# Patient Record
Sex: Male | Born: 1965 | Race: Black or African American | Hispanic: No | Marital: Married | Smoking: Never smoker
Health system: Southern US, Community
[De-identification: ages and names within clinical notes are randomized; demographics above are authoritative.]

## PROBLEM LIST (undated history)

## (undated) DIAGNOSIS — E785 Hyperlipidemia, unspecified: Secondary | ICD-10-CM

## (undated) DIAGNOSIS — Z860100 Personal history of colon polyps, unspecified: Secondary | ICD-10-CM

## (undated) DIAGNOSIS — K219 Gastro-esophageal reflux disease without esophagitis: Secondary | ICD-10-CM

## (undated) HISTORY — PX: OTHER SURGICAL HISTORY: SHX169

## (undated) HISTORY — DX: Personal history of colon polyps, unspecified: Z86.0100

## (undated) HISTORY — DX: Personal history of colonic polyps: Z86.010

## (undated) HISTORY — DX: Gastro-esophageal reflux disease without esophagitis: K21.9

## (undated) HISTORY — DX: Hyperlipidemia, unspecified: E78.5

---

## 2000-04-27 ENCOUNTER — Encounter: Admission: RE | Admit: 2000-04-27 | Discharge: 2000-04-27 | Payer: Self-pay | Admitting: Family Medicine

## 2000-04-27 ENCOUNTER — Encounter: Payer: Self-pay | Admitting: Family Medicine

## 2008-04-23 ENCOUNTER — Emergency Department (HOSPITAL_COMMUNITY): Admission: EM | Admit: 2008-04-23 | Discharge: 2008-04-23 | Payer: Self-pay | Admitting: Emergency Medicine

## 2008-05-05 ENCOUNTER — Ambulatory Visit (HOSPITAL_COMMUNITY): Admission: RE | Admit: 2008-05-05 | Discharge: 2008-05-05 | Payer: Self-pay | Admitting: Urology

## 2008-11-19 ENCOUNTER — Ambulatory Visit: Payer: Self-pay | Admitting: Gastroenterology

## 2008-12-03 ENCOUNTER — Ambulatory Visit: Payer: Self-pay | Admitting: Gastroenterology

## 2008-12-03 ENCOUNTER — Encounter: Payer: Self-pay | Admitting: Gastroenterology

## 2008-12-05 ENCOUNTER — Encounter: Payer: Self-pay | Admitting: Gastroenterology

## 2009-05-31 IMAGING — CR DG ABDOMEN 1V
1 series · 1 of 1 positions shown · non-contrast
Comparison: CT scan [DATE]

CLINICAL DATA: Right UPJ stone.  For lithotripsy.

ABDOMEN - 1 VIEW

[t abdomen supine]
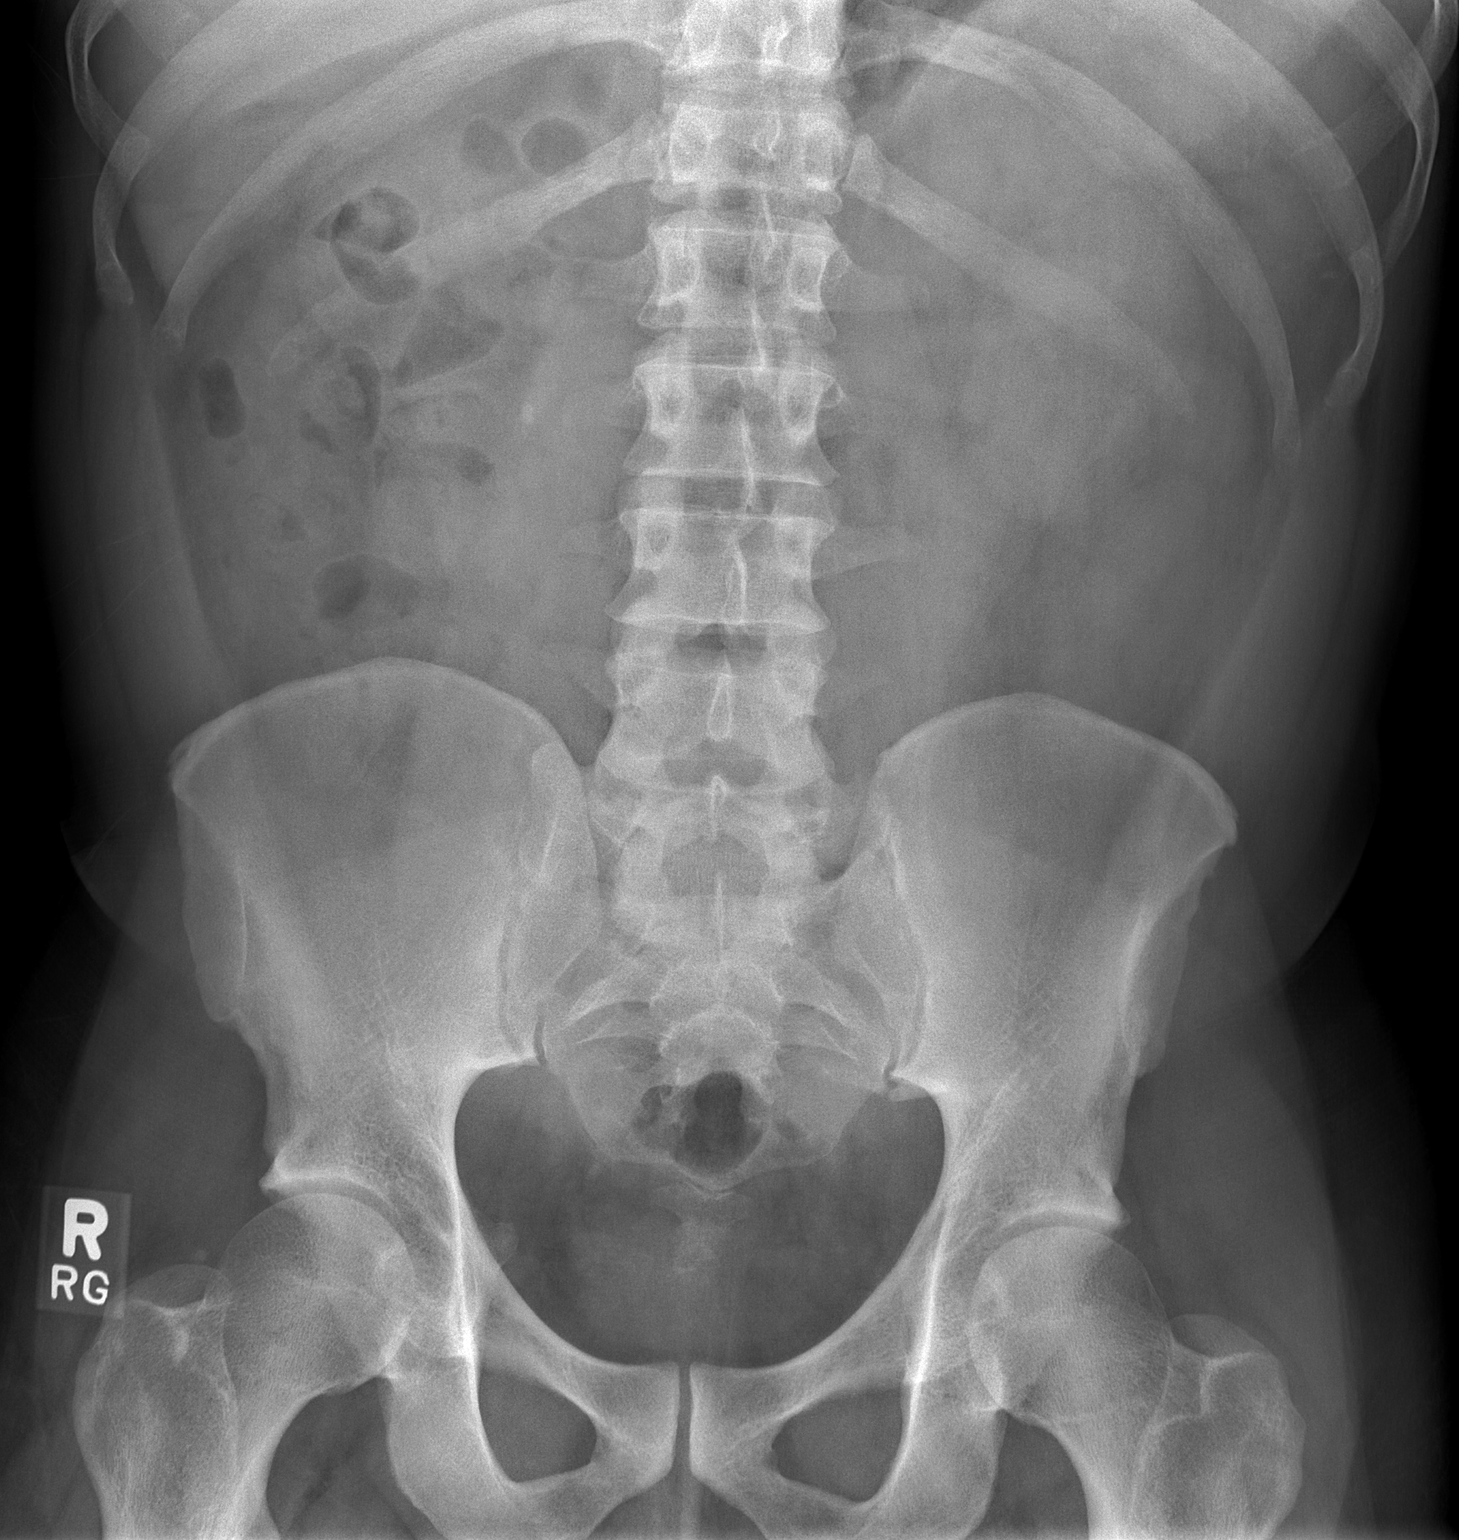

[1 of 1 positions shown; findings below may reference images not displayed]

FINDINGS: There is a 4 mm stone on the right consistent with a
location at the UPJ or proximal ureter.  No other urinary tract
stones are visible.  Bowel gas pattern normal.  No bony finding of
note.
IMPRESSION: 4 mm stone at the right UPJ or proximal ureter.

## 2009-09-24 ENCOUNTER — Emergency Department (HOSPITAL_COMMUNITY): Admission: EM | Admit: 2009-09-24 | Discharge: 2009-09-24 | Payer: Self-pay | Admitting: Emergency Medicine

## 2011-01-25 LAB — DIFFERENTIAL
Basophils Relative: 1 % (ref 0–1)
Eosinophils Absolute: 0 10*3/uL (ref 0.0–0.7)
Lymphocytes Relative: 59 % — ABNORMAL HIGH (ref 12–46)
Monocytes Absolute: 0.3 10*3/uL (ref 0.1–1.0)

## 2011-01-25 LAB — BASIC METABOLIC PANEL
BUN: 8 mg/dL (ref 6–23)
CO2: 30 mEq/L (ref 19–32)
Calcium: 9.1 mg/dL (ref 8.4–10.5)
Chloride: 101 mEq/L (ref 96–112)
Creatinine, Ser: 1.17 mg/dL (ref 0.4–1.5)
GFR calc Af Amer: >60 mL/min (ref >60)
GFR calc non Af Amer: >60 mL/min (ref >60)
Glucose, Bld: 118 mg/dL — ABNORMAL HIGH (ref 70–99)
Potassium: 3.5 mEq/L (ref 3.5–5.1)
Sodium: 137 mEq/L (ref 135–145)

## 2011-01-25 LAB — URINALYSIS, ROUTINE W REFLEX MICROSCOPIC
Leukocytes, UA: NEGATIVE
Protein, ur: 30 mg/dL — AB
Specific Gravity, Urine: 1.029 (ref 1.005–1.030)
Urobilinogen, UA: 0.2 mg/dL (ref 0.0–1.0)

## 2011-01-25 LAB — URINE MICROSCOPIC-ADD ON

## 2011-01-25 LAB — CBC
HCT: 40.1 % (ref 39.0–52.0)
Hemoglobin: 13.9 g/dL (ref 13.0–17.0)
MCV: 91.4 fL (ref 78.0–100.0)
RDW: 13.7 % (ref 11.5–15.5)

## 2011-07-21 LAB — BASIC METABOLIC PANEL
BUN: 9
Calcium: 9.4
Creatinine, Ser: 1.17
GFR calc non Af Amer: >60
Glucose, Bld: 143 — ABNORMAL HIGH
Potassium: 3.4 — ABNORMAL LOW

## 2011-07-21 LAB — DIFFERENTIAL
Basophils Absolute: 0
Eosinophils Relative: 1
Lymphocytes Relative: 60 — ABNORMAL HIGH
Lymphs Abs: 3.6
Neutro Abs: 1.9
Neutrophils Relative %: 32 — ABNORMAL LOW

## 2011-07-21 LAB — CBC
HCT: 41.1
Platelets: 233
RDW: 13.6
WBC: 6

## 2011-07-21 LAB — LIPASE, BLOOD: Lipase: 29

## 2011-12-28 ENCOUNTER — Encounter: Payer: Self-pay | Admitting: Gastroenterology

## 2012-08-07 ENCOUNTER — Encounter: Payer: Self-pay | Admitting: Gastroenterology

## 2014-02-03 ENCOUNTER — Encounter: Payer: Self-pay | Admitting: Internal Medicine

## 2014-03-27 ENCOUNTER — Encounter: Payer: Self-pay | Admitting: Internal Medicine

## 2014-04-29 ENCOUNTER — Ambulatory Visit (AMBULATORY_SURGERY_CENTER): Payer: Self-pay

## 2014-04-29 VITALS — Ht 69.0 in | Wt 172.6 lb

## 2014-04-29 DIAGNOSIS — Z800 Family history of malignant neoplasm of digestive organs: Secondary | ICD-10-CM

## 2014-04-29 DIAGNOSIS — Z86010 Personal history of colonic polyps: Secondary | ICD-10-CM

## 2014-04-29 MED ORDER — MOVIPREP 100 G PO SOLR: ORAL | Status: DC

## 2014-04-29 NOTE — Progress Notes (Signed)
Per pt, no allergies to soy or egg products.Pt not taking any weight loss meds or using  O2 at home. 

## 2014-05-13 ENCOUNTER — Encounter: Payer: Self-pay | Admitting: Internal Medicine

## 2014-05-13 ENCOUNTER — Ambulatory Visit (AMBULATORY_SURGERY_CENTER): Payer: BC Managed Care – PPO | Admitting: Internal Medicine

## 2014-05-13 VITALS — BP 115/62 | HR 58 | Temp 97.3°F | Resp 22 | Ht 69.0 in | Wt 172.0 lb

## 2014-05-13 DIAGNOSIS — Z1211 Encounter for screening for malignant neoplasm of colon: Secondary | ICD-10-CM

## 2014-05-13 DIAGNOSIS — Z86010 Personal history of colonic polyps: Secondary | ICD-10-CM

## 2014-05-13 DIAGNOSIS — Z800 Family history of malignant neoplasm of digestive organs: Secondary | ICD-10-CM

## 2014-05-13 MED ORDER — SODIUM CHLORIDE 0.9 % IV SOLN: 500.0000 mL | INTRAVENOUS | Status: DC

## 2014-05-13 NOTE — Patient Instructions (Signed)

## 2014-05-13 NOTE — Op Note (Signed)
[facility] [address] [place], [id number]   COLONOSCOPY PROCEDURE REPORT  PATIENT: Babs SciaraCotton, Zakiah  MR#: [id number] BIRTHDATE: October 05, 1966 , 47  yrs. old GENDER: Male ENDOSCOPIST: Roxy CedarJohn N Perry Jr, MD REFERRED IO:[id number]:Surveillance Program Recall PROCEDURE DATE:  05/13/2014 PROCEDURE:   Colonoscopy, surveillance First Screening Colonoscopy - Avg.  risk and is 50 yrs.  old or older - No.  Prior Negative Screening - Now for repeat screening. N/A  History of Adenoma - Now for follow-up colonoscopy & has been > or = to 3 yrs.  Yes hx of adenoma.  Has been 3 or more years since last colonoscopy.  Polyps Removed Today? No.  Recommend repeat exam, <10 yrs? Yes.  High risk (family or personal hx). ASA CLASS:   Class I INDICATIONS:Patient's immediate family history of colon cancer (sister 4340's) and Patient's personal history of adenomatous colon polyps.   Index exam 2004 (-) ; 2010 (SA) MEDICATIONS: MAC sedation, administered by CRNA and propofol (Diprivan) 200mg  IV  DESCRIPTION OF PROCEDURE:   After the risks benefits and alternatives of the procedure were thoroughly explained, informed consent was obtained.  A digital rectal exam revealed no abnormalities of the rectum.   The LB [id number]  endoscope was introduced through the anus and advanced to the cecum, which was identified by both the appendix and ileocecal valve. No adverse events experienced.   The quality of the prep was excellent, using MoviPrep  The instrument was then slowly withdrawn as the colon was fully examined.      COLON FINDINGS: A normal appearing cecum, ileocecal valve, and appendiceal orifice were identified.  The ascending, hepatic flexure, transverse, splenic flexure, descending, sigmoid colon and rectum appeared unremarkable.  No polyps or cancers were seen. Retroflexed views revealed no abnormalities. The time to cecum=1 minutes 29 seconds.  Withdrawal time=9 minutes 39 seconds.   The scope was withdrawn and the procedure completed.  COMPLICATIONS: There were no complications.  ENDOSCOPIC IMPRESSION: 1. Normal colon  RECOMMENDATIONS: 1. Follow up colonoscopy in 5 years (Family Hx)   eSigned:  Roxy CedarJohn N Perry Jr, MD 05/13/2014 9:20 AM   cc: The Patient and Elias Elseobert Reade, MD

## 2014-05-14 ENCOUNTER — Telehealth: Payer: Self-pay | Admitting: *Deleted

## 2014-05-14 NOTE — Telephone Encounter (Signed)
  Follow up Call-  Call back number 05/13/2014  Post procedure Call Back phone  # [phone]  Permission to leave phone message Yes     Patient questions:  Do you have a fever, pain , or abdominal swelling? No. Pain Score  0 *  Have you tolerated food without any problems? Yes.    Have you been able to return to your normal activities? Yes.    Do you have any questions about your discharge instructions: Diet   No. Medications  No. Follow up visit  No.  Do you have questions or concerns about your Care? No.  Actions: * If pain score is 4 or above: No action needed, pain <4.

## 2014-07-11 ENCOUNTER — Encounter: Payer: Self-pay | Admitting: Gastroenterology

## 2017-04-18 ENCOUNTER — Ambulatory Visit
Admission: RE | Admit: 2017-04-18 | Discharge: 2017-04-18 | Disposition: A | Discharge status: Home / Self Care | Payer: Managed Care, Other (non HMO) | Source: Ambulatory Visit | Attending: Family Medicine | Admitting: Family Medicine

## 2017-04-18 ENCOUNTER — Other Ambulatory Visit: Payer: Self-pay | Admitting: Family Medicine

## 2017-04-18 DIAGNOSIS — M79642 Pain in left hand: Secondary | ICD-10-CM

## 2018-05-14 IMAGING — CR DG HAND 2V*L*
2 series · 2 of 2 positions shown · non-contrast
Comparison: None.

CLINICAL DATA: C/o onset acute pain LEFT hand at 6nd-3-4th MCP jts
into [place] 2 weeks / no trauma or bites / ROM decreased / concern
for OA

EXAM:
LEFT HAND - 2 VIEW

[x hand pa left]
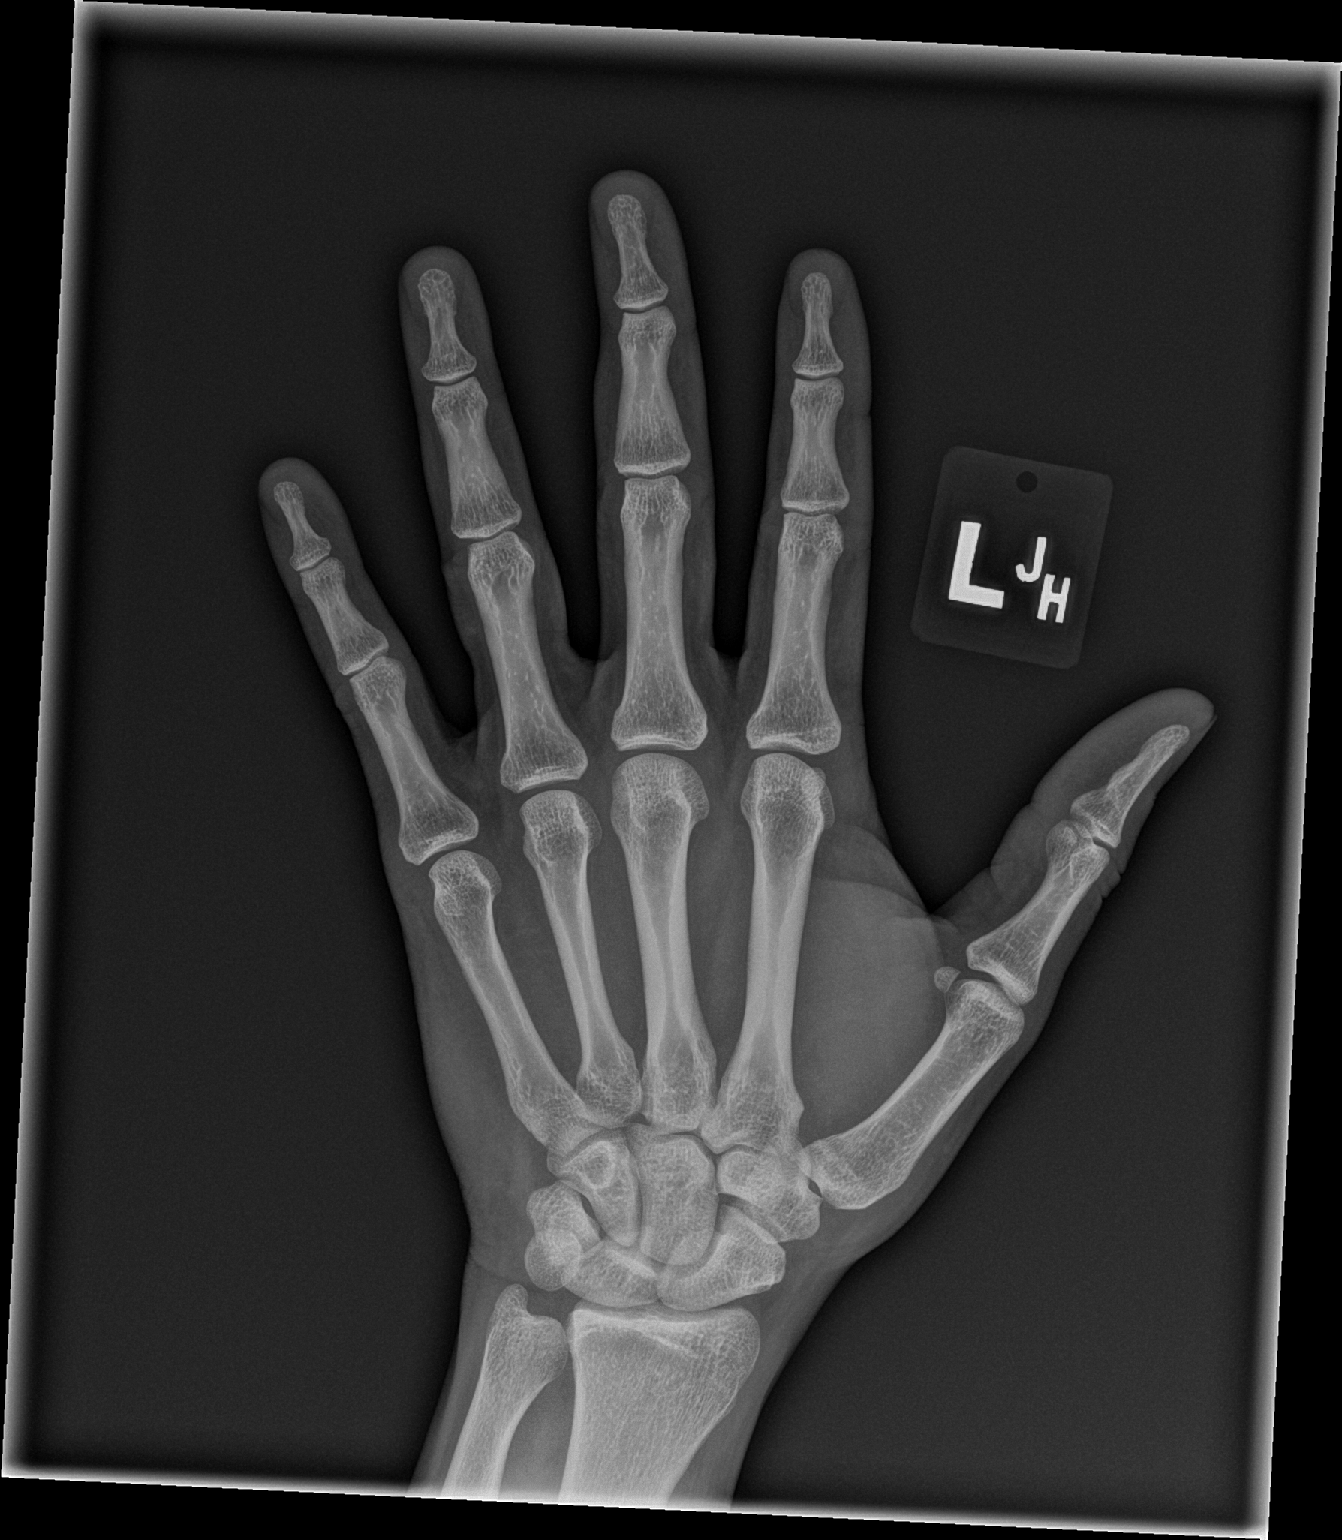

[x hand lat left]
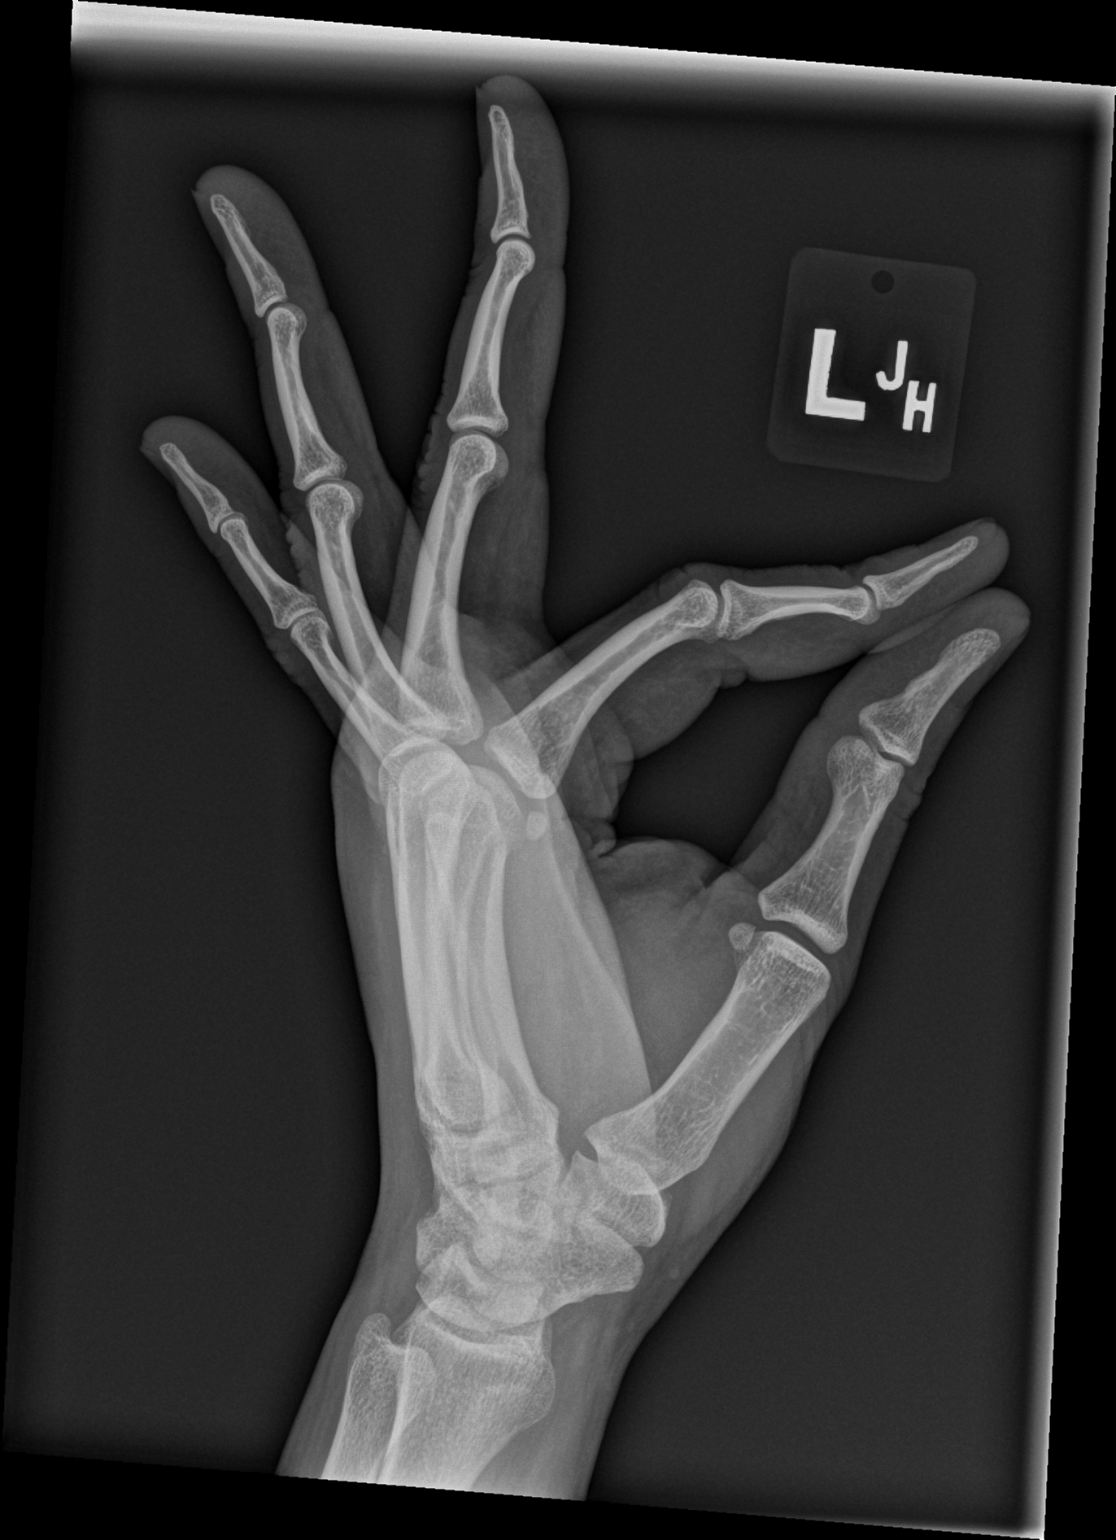

[2 of 2 positions shown; findings below may reference images not displayed]

FINDINGS: No fracture.  No bone lesion.

The joints are normally spaced and aligned. There are no
arthropathic changes.

Soft tissues are unremarkable.
IMPRESSION: Negative.

## 2019-04-24 ENCOUNTER — Encounter: Payer: Self-pay | Admitting: Internal Medicine

## 2019-07-12 ENCOUNTER — Encounter: Payer: Self-pay | Admitting: Internal Medicine

## 2019-07-25 HISTORY — PX: COLONOSCOPY: SHX174

## 2019-08-06 ENCOUNTER — Other Ambulatory Visit: Payer: Self-pay

## 2019-08-06 ENCOUNTER — Ambulatory Visit (AMBULATORY_SURGERY_CENTER): Payer: Self-pay

## 2019-08-06 VITALS — Temp 96.8°F | Ht 69.0 in | Wt 184.6 lb

## 2019-08-06 DIAGNOSIS — Z800 Family history of malignant neoplasm of digestive organs: Secondary | ICD-10-CM

## 2019-08-06 DIAGNOSIS — Z860100 Personal history of colon polyps, unspecified: Secondary | ICD-10-CM

## 2019-08-06 MED ORDER — NA SULFATE-K SULFATE-MG SULF 17.5-3.13-1.6 GM/177ML PO SOLN: 1.0000 | Freq: Once | ORAL | 0 refills | Status: AC

## 2019-08-06 NOTE — Progress Notes (Signed)
Denies allergies to eggs or soy products. Denies complication of anesthesia or sedation. Denies use of weight loss medication. Denies use of O2.   Emmi instructions given for colonoscopy.  Patient is scheduled for a colonoscopy on 08/20/19. Patient declined Covid testing. A 15.00 coupon for Suprep was given to the patient to help with his co-pay.

## 2019-08-09 ENCOUNTER — Encounter: Payer: Self-pay | Admitting: Internal Medicine

## 2019-08-19 ENCOUNTER — Telehealth: Payer: Self-pay

## 2019-08-19 NOTE — Telephone Encounter (Signed)
Covid-19 screening questions   Do you now or have you had a fever in the last 14 days?  Do you have any respiratory symptoms of shortness of breath or cough now or in the last 14 days?  Do you have any family members or close contacts with diagnosed or suspected Covid-19 in the past 14 days?  Have you been tested for Covid-19 and found to be positive?       

## 2019-08-19 NOTE — Telephone Encounter (Signed)
Pt Answered "NO" to all Covid questions

## 2019-08-20 ENCOUNTER — Other Ambulatory Visit: Payer: Self-pay

## 2019-08-20 ENCOUNTER — Ambulatory Visit (AMBULATORY_SURGERY_CENTER): Payer: 59 | Admitting: Internal Medicine

## 2019-08-20 ENCOUNTER — Encounter: Payer: Self-pay | Admitting: Internal Medicine

## 2019-08-20 VITALS — BP 98/69 | HR 76 | Temp 98.0°F | Resp 20 | Ht 69.0 in | Wt 184.6 lb

## 2019-08-20 DIAGNOSIS — Z1211 Encounter for screening for malignant neoplasm of colon: Secondary | ICD-10-CM

## 2019-08-20 DIAGNOSIS — Z86010 Personal history of colonic polyps: Secondary | ICD-10-CM

## 2019-08-20 DIAGNOSIS — Z800 Family history of malignant neoplasm of digestive organs: Secondary | ICD-10-CM

## 2019-08-20 MED ORDER — SODIUM CHLORIDE 0.9 % IV SOLN: 500.0000 mL | Freq: Once | INTRAVENOUS | Status: DC

## 2019-08-20 NOTE — Patient Instructions (Signed)
YOU HAD AN ENDOSCOPIC PROCEDURE TODAY AT THE [facility]:   Refer to the procedure report that was given to you for any specific questions about what was found during the examination.  If the procedure report does not answer your questions, please call your gastroenterologist to clarify.  If you requested that your care partner not be given the details of your procedure findings, then the procedure report has been included in a sealed envelope for you to review at your convenience later.  YOU SHOULD EXPECT: Some feelings of bloating in the abdomen. Passage of more gas than usual.  Walking can help get rid of the air that was put into your GI tract during the procedure and reduce the bloating. If you had a lower endoscopy (such as a colonoscopy or flexible sigmoidoscopy) you may notice spotting of blood in your stool or on the toilet paper. If you underwent a bowel prep for your procedure, you may not have a normal bowel movement for a few days.  Please Note:  You might notice some irritation and congestion in your nose or some drainage.  This is from the oxygen used during your procedure.  There is no need for concern and it should clear up in a day or so.  SYMPTOMS TO REPORT IMMEDIATELY:   Following lower endoscopy (colonoscopy or flexible sigmoidoscopy):  Excessive amounts of blood in the stool  Significant tenderness or worsening of abdominal pains  Swelling of the abdomen that is new, acute  Fever of 100F or higher   For urgent or emergent issues, a gastroenterologist can be reached at any hour by calling [phone].   DIET:  We do recommend a small meal at first, but then you may proceed to your regular diet.  Drink plenty of fluids but you should avoid alcoholic beverages for 24 hours.  MEDICATIONS: Continue present medications.  Please see handouts given to you by your recovery nurse.  ACTIVITY:  You should plan to take it easy for the rest of today and you should  NOT DRIVE or use heavy machinery until tomorrow (because of the sedation medicines used during the test).    FOLLOW UP: Our staff will call the number listed on your records 48-72 hours following your procedure to check on you and address any questions or concerns that you may have regarding the information given to you following your procedure. If we do not reach you, we will leave a message.  We will attempt to reach you two times.  During this call, we will ask if you have developed any symptoms of COVID 19. If you develop any symptoms (ie: fever, flu-like symptoms, shortness of breath, cough etc.) before then, please call [phone].  If you test positive for Covid 19 in the 2 weeks post procedure, please call and report this information to us.    If any biopsies were taken you will be contacted by phone or by letter within the next 1-3 weeks.  Please call us at [phone] if you have not heard about the biopsies in 3 weeks.   Thank you for allowing us to provide for your healthcare needs today.   SIGNATURES/CONFIDENTIALITY: You and/or your care partner have signed paperwork which will be entered into your electronic medical record.  These signatures attest to the fact that that the information above on your After Visit Summary has been reviewed and is understood.  Full responsibility of the confidentiality of this discharge information lies with you and/or   your care-partner. 

## 2019-08-20 NOTE — Progress Notes (Signed)
Report to PACU, RN, vss, BBS= Clear.  

## 2019-08-20 NOTE — Progress Notes (Signed)
JB - Temp CW - VS   Pt's states no medical or surgical changes since previsit or office visit.   

## 2019-08-20 NOTE — Op Note (Signed)
[facility] Patient Name: Robert Hancock Procedure Date: 08/20/2019 9:43 AM MRN: [id number] Endoscopist: Wilhemina BonitoJohn N. Marina GoodellPerry , MD Age: 89 Referring MD:  Date of Birth: 1966/08/18 Gender: Male Account #: [id number] Procedure:                Colonoscopy Indications:              Screening in patient at increased risk: Colorectal                            cancer in sister before age 860 86(40). Index                            examination 2004 (normal), follow-up examinations                            2010 (sessile serrated polyp), 2015 (normal) Medicines:                Monitored Anesthesia Care Procedure:                Pre-Anesthesia Assessment:                           - Prior to the procedure, a History and Physical                            was performed, and patient medications and                            allergies were reviewed. The patient's tolerance of                            previous anesthesia was also reviewed. The risks                            and benefits of the procedure and the sedation                            options and risks were discussed with the patient.                            All questions were answered, and informed consent                            was obtained. Prior Anticoagulants: The patient has                            taken no previous anticoagulant or antiplatelet                            agents. ASA Grade Assessment: II - A patient with                            mild systemic disease. After reviewing the risks  and benefits, the patient was deemed in                            satisfactory condition to undergo the procedure.                           After obtaining informed consent, the colonoscope                            was passed under direct vision. Throughout the                            procedure, the patient's blood pressure, pulse, and                            oxygen saturations were  monitored continuously. The                            Colonoscope was introduced through the anus and                            advanced to the the terminal ileum. The ileocecal                            valve, appendiceal orifice, and rectum were                            photographed. The quality of the bowel preparation                            was excellent. The colonoscopy was performed                            without difficulty. The patient tolerated the                            procedure well. The bowel preparation used was                            SUPREP via split dose instruction. Scope In: 9:58:23 AM Scope Out: 10:07:35 AM Scope Withdrawal Time: 0 hours 7 minutes 47 seconds  Total Procedure Duration: 0 hours 9 minutes 12 seconds  Findings:                 The entire examined colon appeared normal on direct                            and retroflexion views save a rare left-sided                            diverticula. Complications:            No immediate complications. Estimated blood loss:  None. Estimated Blood Loss:     Estimated blood loss: none. Impression:               - The entire examined colon is normal on direct and                            retroflexion views save a rare left-sided                            diverticula.                           - No specimens collected. Recommendation:           - Repeat colonoscopy in 5 years for                            surveillance(family history).                           - Patient has a contact number available for                            emergencies. The signs and symptoms of potential                            delayed complications were discussed with the                            patient. Return to normal activities tomorrow.                            Written discharge instructions were provided to the                            patient.                           - Resume  previous diet.                           - Continue present medications. Docia Chuck. Henrene Pastor, MD 08/20/2019 10:16:45 AM This report has been signed electronically.

## 2019-08-22 ENCOUNTER — Telehealth: Payer: Self-pay

## 2019-08-22 NOTE — Telephone Encounter (Signed)
  Follow up Call-  Call back number 08/20/2019  Post procedure Call Back phone  # [phone] cell  Permission to leave phone message Yes  Some recent data might be hidden     Patient questions:  Do you have a fever, pain , or abdominal swelling? No. Pain Score  0 *  Have you tolerated food without any problems? Yes.    Have you been able to return to your normal activities? Yes.    Do you have any questions about your discharge instructions: Diet   No. Medications  No. Follow up visit  No.  Do you have questions or concerns about your Care? No.  Actions: * If pain score is 4 or above: No action needed, pain <4. 1. Have you developed a fever since your procedure? no  2.   Have you had an respiratory symptoms (SOB or cough) since your procedure? no  3.   Have you tested positive for COVID 19 since your procedure no  4.   Have you had any family members/close contacts diagnosed with the COVID 19 since your procedure?  no   If yes to any of these questions please route to Joylene John, RN and Alphonsa Gin, Therapist, sports.

## 2019-12-28 ENCOUNTER — Ambulatory Visit: Payer: 59 | Attending: Internal Medicine

## 2019-12-28 DIAGNOSIS — Z23 Encounter for immunization: Secondary | ICD-10-CM | POA: Insufficient documentation

## 2019-12-28 NOTE — Progress Notes (Signed)
   Covid-19 Vaccination Clinic  Name:  Robert Hancock    MRN: [id number] DOB: [birth year]  12/28/2019  Robert Hancock was observed post Covid-19 immunization for 15 minutes without incident. He was provided with Vaccine Information Sheet and instruction to access the V-Safe system.   Robert Hancock was instructed to call 911 with any severe reactions post vaccine: Marland Kitchen Difficulty breathing  . Swelling of face and throat  . A fast heartbeat  . A bad rash all over body  . Dizziness and weakness   Immunizations Administered    Name Date Dose VIS Date Route   Pfizer COVID-19 Vaccine 12/28/2019  9:19 AM 0.3 mL 10/04/2019 Intramuscular   Manufacturer: [employer], [employer]   Lot: [id number]   NDC: [address]-3

## 2020-01-18 ENCOUNTER — Ambulatory Visit: Payer: 59 | Attending: Internal Medicine

## 2020-01-18 DIAGNOSIS — Z23 Encounter for immunization: Secondary | ICD-10-CM

## 2020-01-18 NOTE — Progress Notes (Signed)
   Covid-19 Vaccination Clinic  Name:  Robert Hancock    MRN: [id number] DOB: [birth year]  01/18/2020  [name] was observed post Covid-19 immunization for 15 minutes without incident. He was provided with Vaccine Information Sheet and instruction to access the V-Safe system.   [name] was instructed to call 911 with any severe reactions post vaccine: Marland Kitchen Difficulty breathing  . Swelling of face and throat  . A fast heartbeat  . A bad rash all over body  . Dizziness and weakness   Immunizations Administered    Name Date Dose VIS Date Route   Pfizer COVID-19 Vaccine 01/18/2020 10:21 AM 0.3 mL 10/04/2019 Intramuscular   Manufacturer: [employer], [employer]   Lot: [id number]   NDC: [address]-9

## 2021-08-27 ENCOUNTER — Other Ambulatory Visit: Payer: Self-pay | Admitting: Otolaryngology

## 2021-08-27 DIAGNOSIS — Q892 Congenital malformations of other endocrine glands: Secondary | ICD-10-CM

## 2021-09-20 ENCOUNTER — Other Ambulatory Visit: Payer: 59

## 2021-11-01 ENCOUNTER — Ambulatory Visit
Admission: RE | Admit: 2021-11-01 | Discharge: 2021-11-01 | Disposition: A | Discharge status: Home / Self Care | Payer: BC Managed Care – PPO | Source: Ambulatory Visit | Attending: Otolaryngology | Admitting: Otolaryngology

## 2021-11-01 DIAGNOSIS — Q892 Congenital malformations of other endocrine glands: Secondary | ICD-10-CM

## 2021-11-01 DIAGNOSIS — E041 Nontoxic single thyroid nodule: Secondary | ICD-10-CM | POA: Diagnosis not present

## 2021-11-18 DIAGNOSIS — M5412 Radiculopathy, cervical region: Secondary | ICD-10-CM | POA: Diagnosis not present

## 2021-11-18 DIAGNOSIS — R030 Elevated blood-pressure reading, without diagnosis of hypertension: Secondary | ICD-10-CM | POA: Diagnosis not present

## 2022-02-07 DIAGNOSIS — E7800 Pure hypercholesterolemia, unspecified: Secondary | ICD-10-CM | POA: Diagnosis not present

## 2022-10-31 DIAGNOSIS — R7301 Impaired fasting glucose: Secondary | ICD-10-CM | POA: Diagnosis not present

## 2022-10-31 DIAGNOSIS — Z125 Encounter for screening for malignant neoplasm of prostate: Secondary | ICD-10-CM | POA: Diagnosis not present

## 2022-10-31 DIAGNOSIS — E7800 Pure hypercholesterolemia, unspecified: Secondary | ICD-10-CM | POA: Diagnosis not present

## 2022-10-31 DIAGNOSIS — Z0000 Encounter for general adult medical examination without abnormal findings: Secondary | ICD-10-CM | POA: Diagnosis not present

## 2022-11-27 IMAGING — US US THYROID
1 series · 14 of 25 positions shown · non-contrast
Comparison: None.

CLINICAL DATA: 85-year-old male with history of thyroglossal duct
cyst.

EXAM:
THYROID ULTRASOUND
TECHNIQUE: Ultrasound examination of the thyroid gland and adjacent soft
tissues was performed.

[Series 1: us thyroid · 0.07mm/px · 14 of 46 slices shown]
[im 1/46]
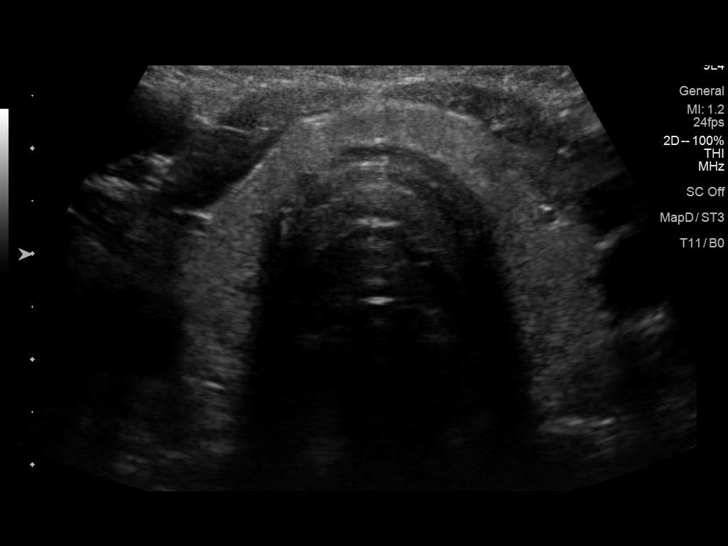
[im 4/46]
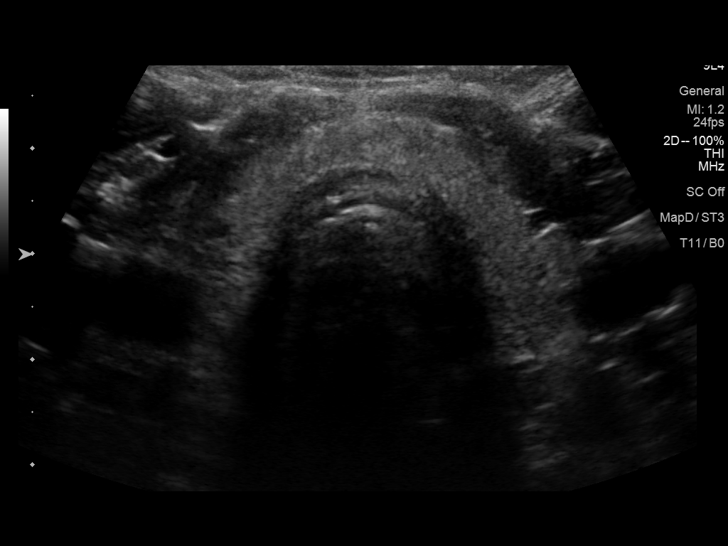
[im 8/46]
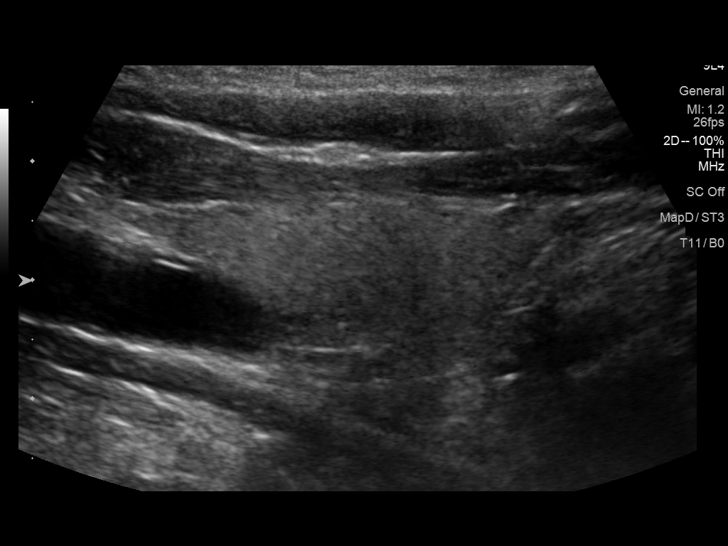
[im 12/46]
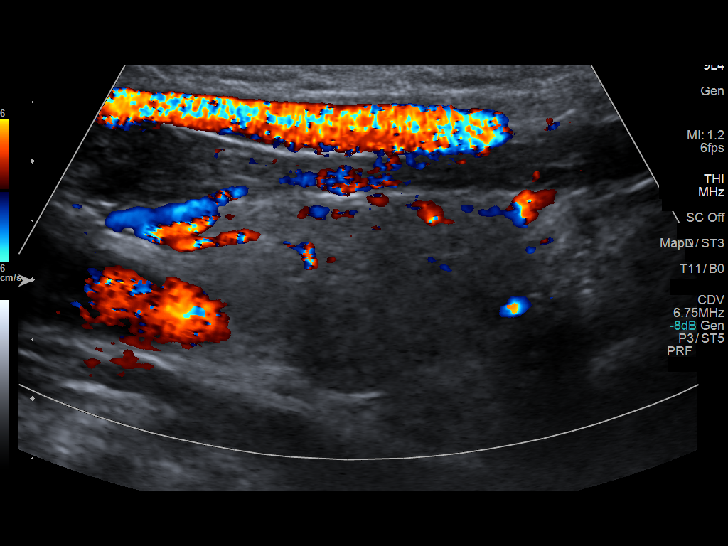
[im 16/46]
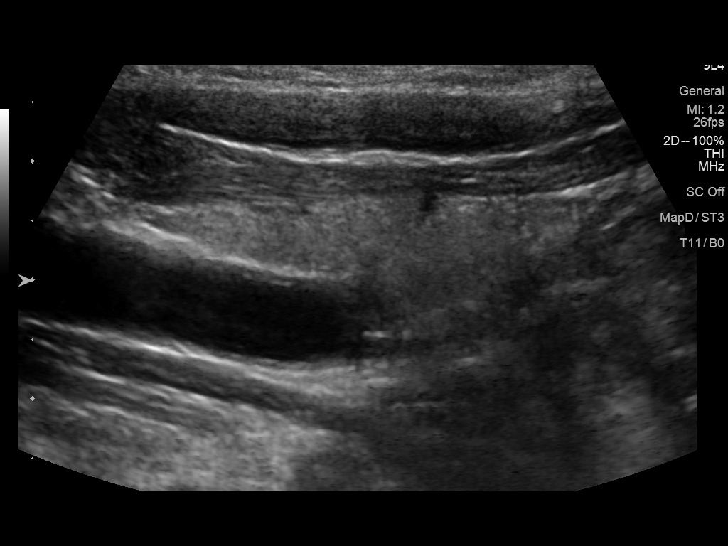
[im 17/46]
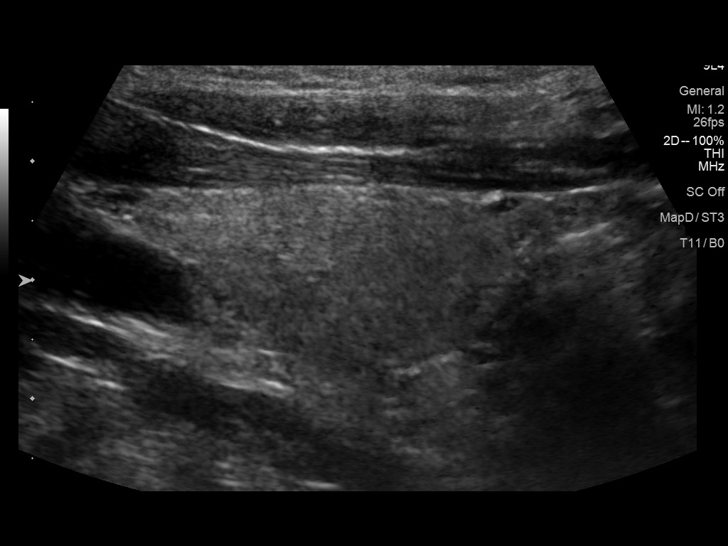
[im 21/46]
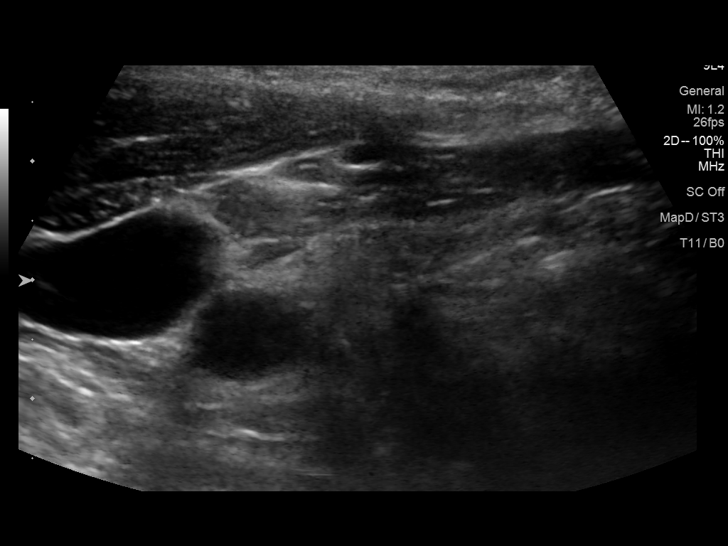
[im 25/46]
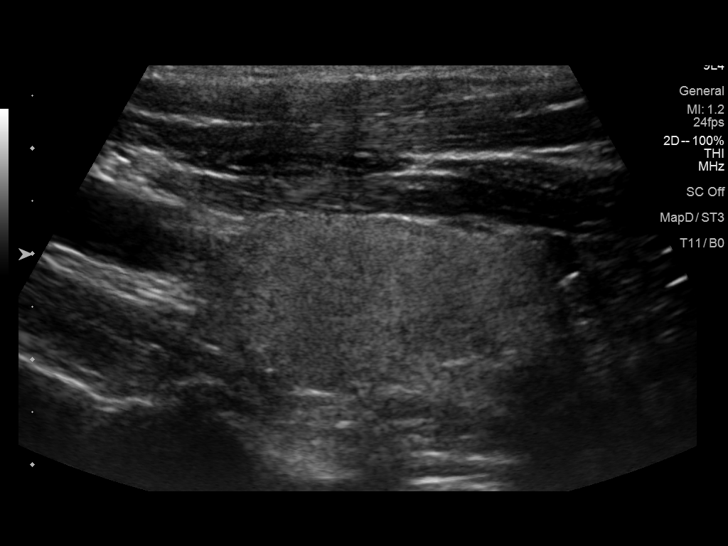
[im 29/46]
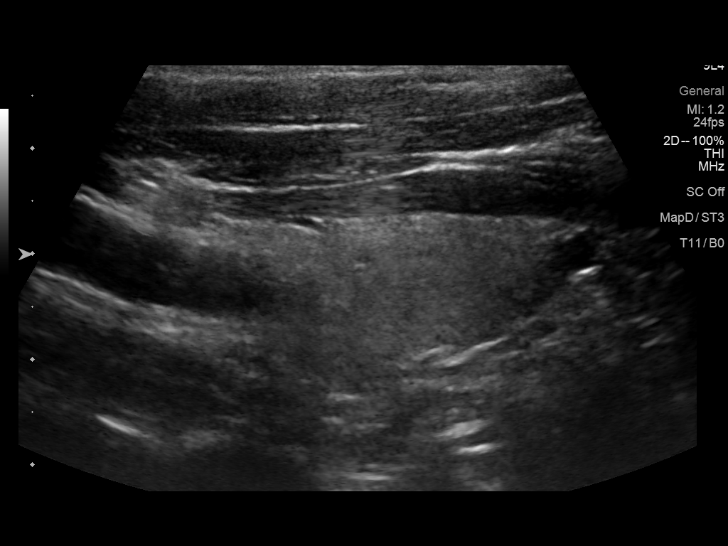
[im 31/46]
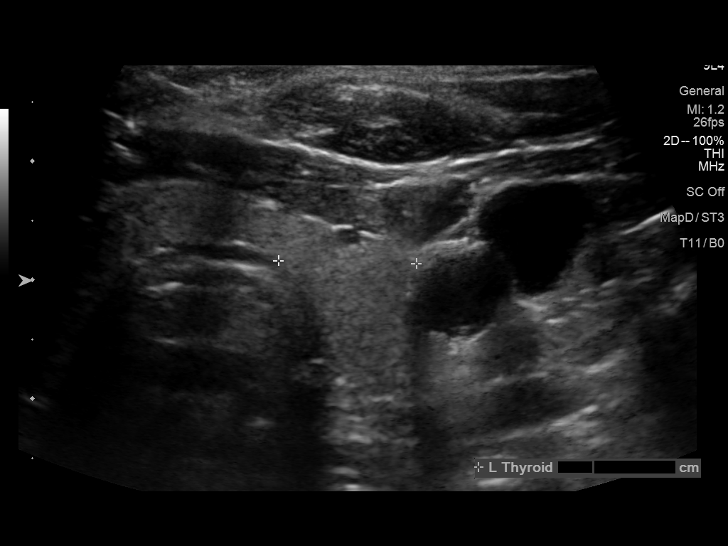
[im 34/46]
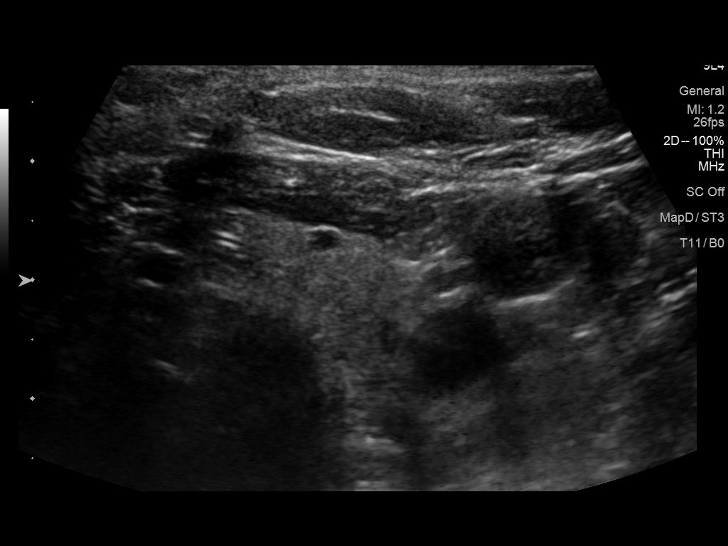
[im 38/46]
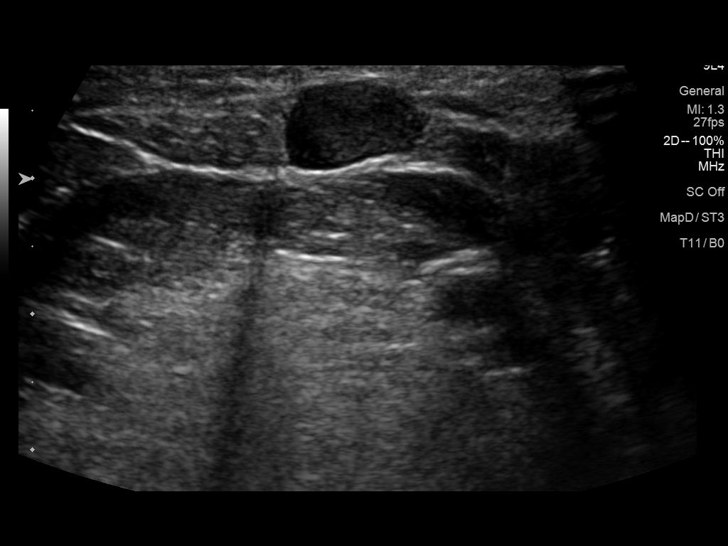
[im 42/46]
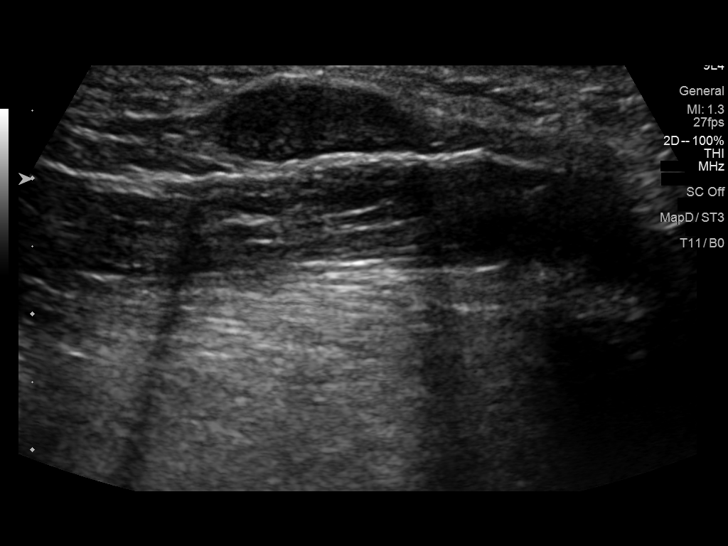
[im 46/46]
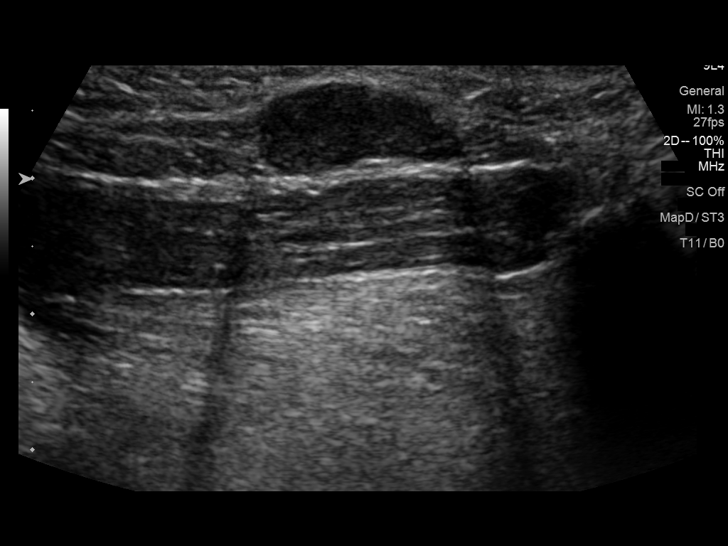

[14 of 25 positions shown; findings below may reference images not displayed]

FINDINGS: Parenchymal Echotexture: Normal

Isthmus: 0.4 cm

Right lobe: 4.3 x 1.3 x 1.8 cm

Left lobe: 3.9 x 1.8 x 1.2 cm

_________________________________________________________

Estimated total number of nodules >/= 1 cm: 0

Number of spongiform nodules >/=  2 cm not described below (TR1): 0

Number of mixed cystic and solid nodules >/= 1.5 cm not described
below (TR2): 0

_________________________________________________________

No discrete nodules are seen within the thyroid gland.

About the midline submental region is a subcutaneous, simple
appearing cystic structure measuring approximately 1.6 x 1.1 x
cm. No evidence of nodularity or peripheral echogenicity.
IMPRESSION: 1. Midline submental, subcutaneous cystic structure measuring up to
1.6 cm, most compatible with thyroglossal duct cyst.
2. Normal sonographic appearance of the thyroid gland.

The above is in keeping with the ACR TI-RADS recommendations - [HOSPITAL] 9499;[DATE].

## 2023-04-07 DIAGNOSIS — R7303 Prediabetes: Secondary | ICD-10-CM | POA: Diagnosis not present

## 2023-04-07 DIAGNOSIS — R5383 Other fatigue: Secondary | ICD-10-CM | POA: Diagnosis not present

## 2023-04-07 DIAGNOSIS — M722 Plantar fascial fibromatosis: Secondary | ICD-10-CM | POA: Diagnosis not present

## 2023-04-07 DIAGNOSIS — E782 Mixed hyperlipidemia: Secondary | ICD-10-CM | POA: Diagnosis not present

## 2023-07-17 DIAGNOSIS — E7800 Pure hypercholesterolemia, unspecified: Secondary | ICD-10-CM | POA: Diagnosis not present

## 2023-11-07 DIAGNOSIS — Z23 Encounter for immunization: Secondary | ICD-10-CM | POA: Diagnosis not present

## 2023-11-07 DIAGNOSIS — E559 Vitamin D deficiency, unspecified: Secondary | ICD-10-CM | POA: Diagnosis not present

## 2023-11-07 DIAGNOSIS — Z0000 Encounter for general adult medical examination without abnormal findings: Secondary | ICD-10-CM | POA: Diagnosis not present

## 2023-11-07 DIAGNOSIS — Z125 Encounter for screening for malignant neoplasm of prostate: Secondary | ICD-10-CM | POA: Diagnosis not present

## 2023-11-07 DIAGNOSIS — E7800 Pure hypercholesterolemia, unspecified: Secondary | ICD-10-CM | POA: Diagnosis not present

## 2024-05-06 DIAGNOSIS — E559 Vitamin D deficiency, unspecified: Secondary | ICD-10-CM | POA: Diagnosis not present

## 2024-05-06 DIAGNOSIS — E7800 Pure hypercholesterolemia, unspecified: Secondary | ICD-10-CM | POA: Diagnosis not present

## 2024-05-07 DIAGNOSIS — E7800 Pure hypercholesterolemia, unspecified: Secondary | ICD-10-CM | POA: Diagnosis not present

## 2024-05-07 DIAGNOSIS — E559 Vitamin D deficiency, unspecified: Secondary | ICD-10-CM | POA: Diagnosis not present

## 2024-06-18 ENCOUNTER — Encounter: Payer: Self-pay | Admitting: Internal Medicine

## 2024-07-29 ENCOUNTER — Ambulatory Visit (AMBULATORY_SURGERY_CENTER)

## 2024-07-29 ENCOUNTER — Encounter: Payer: Self-pay | Admitting: Internal Medicine

## 2024-07-29 VITALS — Ht 69.0 in | Wt 190.0 lb

## 2024-07-29 DIAGNOSIS — Z800 Family history of malignant neoplasm of digestive organs: Secondary | ICD-10-CM

## 2024-07-29 MED ORDER — NA SULFATE-K SULFATE-MG SULF 17.5-3.13-1.6 GM/177ML PO SOLN: 1.0000 | Freq: Once | ORAL | 0 refills | Status: AC

## 2024-07-29 NOTE — Progress Notes (Signed)

## 2024-08-12 ENCOUNTER — Ambulatory Visit: Admitting: Internal Medicine

## 2024-08-12 ENCOUNTER — Encounter: Payer: Self-pay | Admitting: Internal Medicine

## 2024-08-12 VITALS — BP 120/78 | HR 66 | Temp 97.2°F | Resp 13 | Ht 69.0 in | Wt 190.0 lb

## 2024-08-12 DIAGNOSIS — Z800 Family history of malignant neoplasm of digestive organs: Secondary | ICD-10-CM | POA: Diagnosis not present

## 2024-08-12 DIAGNOSIS — Z860101 Personal history of adenomatous and serrated colon polyps: Secondary | ICD-10-CM | POA: Diagnosis not present

## 2024-08-12 DIAGNOSIS — K5730 Diverticulosis of large intestine without perforation or abscess without bleeding: Secondary | ICD-10-CM | POA: Diagnosis not present

## 2024-08-12 DIAGNOSIS — Z860100 Personal history of colon polyps, unspecified: Secondary | ICD-10-CM

## 2024-08-12 DIAGNOSIS — Z1211 Encounter for screening for malignant neoplasm of colon: Secondary | ICD-10-CM

## 2024-08-12 MED ORDER — SODIUM CHLORIDE 0.9 % IV SOLN: 500.0000 mL | Freq: Once | INTRAVENOUS | Status: AC

## 2024-08-12 NOTE — Progress Notes (Signed)
 Pt's states no medical or surgical changes since previsit or office visit.

## 2024-08-12 NOTE — Op Note (Signed)
 [facility] Patient Name: Macy Lingenfelter Procedure Date: 08/12/2024 7:06 AM MRN: [id number] Endoscopist: Norleen SAILOR. Abran , MD, [id number] Age: 58 Referring MD:  Date of Birth: 03-19-1966 Gender: Male Account #: [id number] Procedure:                Colonoscopy Indications:              High risk colon cancer surveillance: Personal                            history of sessile serrated colon polyp (less than                            10 mm in size) with no dysplasia. Family history of                            colon cancer in sister less than age 52. Previous                            examinations 2004, 2010, 2015, 2020 Medicines:                Monitored Anesthesia Care Procedure:                Pre-Anesthesia Assessment:                           - Prior to the procedure, a History and Physical                            was performed, and patient medications and                            allergies were reviewed. The patient's tolerance of                            previous anesthesia was also reviewed. The risks                            and benefits of the procedure and the sedation                            options and risks were discussed with the patient.                            All questions were answered, and informed consent                            was obtained. Prior Anticoagulants: The patient has                            taken no anticoagulant or antiplatelet agents. ASA                            Grade Assessment: II - A patient with mild systemic  disease. After reviewing the risks and benefits,                            the patient was deemed in satisfactory condition to                            undergo the procedure.                           After obtaining informed consent, the colonoscope                            was passed under direct vision. Throughout the                            procedure, the patient's  blood pressure, pulse, and                            oxygen saturations were monitored continuously. The                            CF [id number] #[id number] was introduced through the anus                            and advanced to the the cecum, identified by                            appendiceal orifice and ileocecal valve. The                            ileocecal valve, appendiceal orifice, and rectum                            were photographed. The quality of the bowel                            preparation was excellent. The colonoscopy was                            performed without difficulty. The patient tolerated                            the procedure well. The bowel preparation used was                            SUPREP via split dose instruction. Scope In: 8:26:41 AM Scope Out: 8:37:29 AM Scope Withdrawal Time: 0 hours 9 minutes 22 seconds  Total Procedure Duration: 0 hours 10 minutes 48 seconds  Findings:                 Diverticula were found in the sigmoid colon.                           The exam was otherwise without abnormality on  direct and retroflexion views. Complications:            No immediate complications. Estimated blood loss:                            None. Estimated Blood Loss:     Estimated blood loss: none. Impression:               - Diverticulosis in the sigmoid colon.                           - The examination was otherwise normal on direct                            and retroflexion views.                           - No specimens collected. Recommendation:           - Repeat colonoscopy in 5 years for surveillance                            (family history).                           - Patient has a contact number available for                            emergencies. The signs and symptoms of potential                            delayed complications were discussed with the                            patient. Return to  normal activities tomorrow.                            Written discharge instructions were provided to the                            patient.                           - Resume previous diet.                           - Continue present medications. Norleen SAILOR. Abran, MD 08/12/2024 8:49:57 AM This report has been signed electronically.

## 2024-08-12 NOTE — Patient Instructions (Addendum)
  Resume previous diet. Continue present medications. Repeat colonoscopy in 5 years for surveillance   YOU HAD AN ENDOSCOPIC PROCEDURE TODAY AT [facility]:   Refer to the procedure report that was given to you for any specific questions about what was found during the examination.  If the procedure report does not answer your questions, please call your gastroenterologist to clarify.  If you requested that your care partner not be given the details of your procedure findings, then the procedure report has been included in a sealed envelope for you to review at your convenience later.  YOU SHOULD EXPECT: Some feelings of bloating in the abdomen. Passage of more gas than usual.  Walking can help get rid of the air that was put into your GI tract during the procedure and reduce the bloating. If you had a lower endoscopy (such as a colonoscopy or flexible sigmoidoscopy) you may notice spotting of blood in your stool or on the toilet paper. If you underwent a bowel prep for your procedure, you may not have a normal bowel movement for a few days.  Please Note:  You might notice some irritation and congestion in your nose or some drainage.  This is from the oxygen used during your procedure.  There is no need for concern and it should clear up in a day or so.  SYMPTOMS TO REPORT IMMEDIATELY:  Following lower endoscopy (colonoscopy or flexible sigmoidoscopy):  Excessive amounts of blood in the stool  Significant tenderness or worsening of abdominal pains  Swelling of the abdomen that is new, acute  Fever of 100F or higher  For urgent or emergent issues, a gastroenterologist can be reached at any hour by calling [phone]. Do not use MyChart messaging for urgent concerns.    DIET:  We do recommend a small meal at first, but then you may proceed to your regular diet.  Drink plenty of fluids but you should avoid alcoholic beverages for 24 hours.  ACTIVITY:  You should plan to  take it easy for the rest of today and you should NOT DRIVE or use heavy machinery until tomorrow (because of the sedation medicines used during the test).    FOLLOW UP: Our staff will call the number listed on your records the next business day following your procedure.  We will call around 7:15- 8:00 am to check on you and address any questions or concerns that you may have regarding the information given to you following your procedure. If we do not reach you, we will leave a message.     If any biopsies were taken you will be contacted by phone or by letter within the next 1-3 weeks.  Please call us at [phone] if you have not heard about the biopsies in 3 weeks.    SIGNATURES/CONFIDENTIALITY: You and/or your care partner have signed paperwork which will be entered into your electronic medical record.  These signatures attest to the fact that that the information above on your After Visit Summary has been reviewed and is understood.  Full responsibility of the confidentiality of this discharge information lies with you and/or your care-partner.

## 2024-08-12 NOTE — Progress Notes (Signed)
 Transferred to PACU via stretcher, arousing, VSS.

## 2024-08-12 NOTE — Progress Notes (Signed)
 HISTORY OF PRESENT ILLNESS:  Robert Hancock is a 58 y.o. male with a family history of colon cancer in sister less than age 33.  Also personal history of sessile serrated polyp.  Now for surveillance colonoscopy  REVIEW OF SYSTEMS:  All non-GI ROS negative except for  Past Medical History:  Diagnosis Date   GERD (gastroesophageal reflux disease)    Hx of colonic polyps    Hyperlipidemia     Past Surgical History:  Procedure Laterality Date   COLONOSCOPY  07/2019   normal - check every 5 yrs due to family hx   wisdom theeth extraction      Social History Robert Hancock  reports that he has never smoked. He has never used smokeless tobacco. He reports that he does not drink alcohol and does not use drugs.  family history includes Colon cancer in his sister.  No Known Allergies     PHYSICAL EXAMINATION: Vital signs: BP 132/76   Pulse 70   Temp (!) 97.2 F (36.2 C) (Temporal)   Ht 5' 9 (1.753 m)   Wt 190 lb (86.2 kg)   SpO2 99%   BMI 28.06 kg/m  General: Well-developed, well-nourished, no acute distress HEENT: Sclerae are anicteric, conjunctiva pink. Oral mucosa intact Lungs: Clear Heart: Regular Abdomen: soft, nontender, nondistended, no obvious ascites, no peritoneal signs, normal bowel sounds. No organomegaly. Extremities: No edema Psychiatric: alert and oriented x3. Cooperative     ASSESSMENT:  History of colon cancer Personal history [facility]  PLAN:  Colonoscopy

## 2024-08-13 ENCOUNTER — Telehealth: Payer: Self-pay

## 2024-08-13 NOTE — Telephone Encounter (Signed)
 No answer on follow-up call. Voice mail box full. Unable to leave message for pt.

## 2024-10-23 DIAGNOSIS — H6983 Other specified disorders of Eustachian tube, bilateral: Secondary | ICD-10-CM | POA: Diagnosis not present
